# Patient Record
Sex: Male | Born: 1990 | Race: Black or African American | Hispanic: No | Marital: Single | Smoking: Never smoker
Health system: Southern US, Community
[De-identification: ages and names within clinical notes are randomized; demographics above are authoritative.]

## PROBLEM LIST (undated history)

## (undated) DIAGNOSIS — S83412A Sprain of medial collateral ligament of left knee, initial encounter: Secondary | ICD-10-CM

---
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
## 2012-02-20 ENCOUNTER — Encounter (HOSPITAL_COMMUNITY): Payer: Self-pay | Admitting: Emergency Medicine

## 2012-02-20 ENCOUNTER — Emergency Department (HOSPITAL_COMMUNITY): Payer: Self-pay

## 2012-02-20 ENCOUNTER — Emergency Department (HOSPITAL_COMMUNITY)
Admission: EM | Admit: 2012-02-20 | Discharge: 2012-02-20 | Disposition: A | Discharge status: Home Health | Payer: Self-pay | Attending: Emergency Medicine | Admitting: Emergency Medicine

## 2012-02-20 DIAGNOSIS — S6990XA Unspecified injury of unspecified wrist, hand and finger(s), initial encounter: Secondary | ICD-10-CM | POA: Insufficient documentation

## 2012-02-20 DIAGNOSIS — S6390XA Sprain of unspecified part of unspecified wrist and hand, initial encounter: Secondary | ICD-10-CM

## 2012-02-20 DIAGNOSIS — W19XXXA Unspecified fall, initial encounter: Secondary | ICD-10-CM | POA: Insufficient documentation

## 2012-02-20 NOTE — ED Provider Notes (Signed)
History   This chart was scribed for Jay Gaskins, MD by Sofie Rower. The patient was seen in room TR08C/TR08C and the patient's care was started at 2:16 PM    CSN: [id number]  Arrival date & time 02/20/12  1402   None     Chief Complaint  Patient presents with  . Hand Pain     Patient is a 21 y.o. male presenting with hand pain. The history is provided by the patient. No language interpreter was used.  Hand Pain This is a new problem. The current episode started more than 1 week ago. The problem occurs constantly. The problem has not changed since onset.The symptoms are aggravated by exertion. Nothing relieves the symptoms. He has tried nothing for the symptoms. The treatment provided no relief.  pt reports he fell several weeks ago and FOOSH.  No injuries reported other than pain in left hand.  No head injury.  No other extremity injury.  Denies weakness/numbness to his hands  PMH - none  History reviewed. No pertinent past surgical history.  No family history on file.  History  Substance Use Topics  . Smoking status: Never Smoker   . Smokeless tobacco: Not on file  . Alcohol Use:       Review of Systems  Constitutional: Negative for fever.  Musculoskeletal: Negative for joint swelling.  Neurological: Negative for weakness and numbness.    Allergies  Review of patient's allergies indicates no known allergies.  Home Medications  No current outpatient prescriptions on file.  BP 143/76  Pulse 65  Temp 98.3 F (36.8 C) (Oral)  Resp 20  SpO2 98%  Physical Exam  CONSTITUTIONAL: Well developed/well nourished HEAD AND FACE: Normocephalic/atraumatic EYES: EOMI/PERRL ENMT: Mucous membranes moist NECK: supple no meningeal signs SPINE:entire spine nontender CV: S1/S2 noted, no murmurs/rubs/gallops noted LUNGS: Lungs are clear to auscultation bilaterally, no apparent distress ABDOMEN: soft, nontender, no rebound or guarding GU:no cva tenderness NEURO: Pt is  awake/alert, moves all extremitiesx4 EXTREMITIES: pulses normal, full ROM, left hand tenderness over the ulnar aspect, no snuff box tenderness.  No swelling/bruising noted.  No deformity.  No wrist tenderness. No evidence of tendon injury.  No open skin/erythema noted SKIN: warm, color normal PSYCH: no abnormalities of mood noted   ED Course  Procedures   DIAGNOSTIC STUDIES: Oxygen Saturation is 98% on room air, normal by my interpretation.    COORDINATION OF CARE:  2:18PM- EDP at bedside discusses treatment plan concerning x-ray of left hand.   2:58PM- EDP at bedside to discuss x-ray results.   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  No obvious injury, and I doubt occult fracture (doubt scaphoid injury as no snuffbox tenderness) He reports when he lifts weights/does pushups he has pain.  He has no edema/bruising and can range all fingers/wrist without difficulty.  I offered splint to rest the hand but he refused.  He reports he will f/u with trainer at college.    Dg Hand Complete Left  02/20/2012  *RADIOLOGY REPORT*  Clinical Data: Pain post fall 1 week ago  LEFT HAND - COMPLETE 3+ VIEW  Comparison: None.  Findings: Three views of the left hand submitted.  No acute fracture or subluxation.  No periosteal reaction or bony erosion. No radiopaque foreign body.  IMPRESSION: No acute fracture or subluxation.  Original Report Authenticated By: Natasha Mead, M.D.        MDM  Nursing notes including past medical history and social history reviewed  and considered in documentation xrays reviewed and considered      I personally performed the services described in this documentation, which was scribed in my presence. The recorded information has been reviewed and considered.     Jay Gaskins, MD 02/20/12 1544

## 2012-02-20 NOTE — Progress Notes (Signed)
Consulted by Johnny Bridge RN to obtain sickle center trait testing information for the patient. I contacted the [facility] sickle cell center and spoke with Tywan. He gave me the name and number of the [facility] [phone] at [address]). The offices were closed today (and every Friday) so could not find out the cost, but suspect they are minimal if any. This information was scribed and given to the patient with his verbal appreciation.

## 2012-02-20 NOTE — ED Notes (Signed)
Pt fell on left hand a few weeks ago and it still hurts. Pain is mostly on palm can wiggle fingers has good pulse

## 2012-02-20 NOTE — ED Notes (Signed)
Pt returned from radiology.

## 2014-10-13 ENCOUNTER — Emergency Department (HOSPITAL_COMMUNITY): Payer: 59

## 2014-10-13 ENCOUNTER — Emergency Department (HOSPITAL_COMMUNITY)
Admission: EM | Admit: 2014-10-13 | Discharge: 2014-10-13 | Disposition: A | Discharge status: Home / Self Care | Payer: 59 | Attending: Emergency Medicine | Admitting: Emergency Medicine

## 2014-10-13 ENCOUNTER — Encounter (HOSPITAL_COMMUNITY): Payer: Self-pay | Admitting: Family Medicine

## 2014-10-13 DIAGNOSIS — J029 Acute pharyngitis, unspecified: Secondary | ICD-10-CM | POA: Diagnosis present

## 2014-10-13 DIAGNOSIS — J039 Acute tonsillitis, unspecified: Secondary | ICD-10-CM | POA: Diagnosis not present

## 2014-10-13 LAB — RAPID STREP SCREEN (MED CTR MEBANE ONLY): Streptococcus, Group A Screen (Direct): NEGATIVE

## 2014-10-13 MED ORDER — IOHEXOL 300 MG/ML  SOLN
75.0000 mL | Freq: Once | INTRAMUSCULAR | Status: AC | PRN
  Administered 2014-10-13: 75 mL via INTRAVENOUS

## 2014-10-13 MED ORDER — ACETAMINOPHEN 500 MG PO TABS
1000.0000 mg | ORAL_TABLET | Freq: Once | ORAL | Status: AC
  Administered 2014-10-13: 1000 mg via ORAL
  Filled 2014-10-13: qty 2

## 2014-10-13 MED ORDER — DEXAMETHASONE SODIUM PHOSPHATE 10 MG/ML IJ SOLN: 10.0000 mg | Freq: Once | INTRAMUSCULAR | Status: AC

## 2014-10-13 MED ORDER — DEXAMETHASONE SODIUM PHOSPHATE 10 MG/ML IJ SOLN
10.0000 mg | Freq: Once | INTRAMUSCULAR | Status: DC
  Filled 2014-10-13: qty 1

## 2014-10-13 MED ORDER — DEXAMETHASONE SODIUM PHOSPHATE 10 MG/ML IJ SOLN
10.0000 mg | Freq: Once | INTRAMUSCULAR | Status: AC
  Administered 2014-10-13: 10 mg via INTRAVENOUS

## 2014-10-13 NOTE — ED Notes (Signed)
Pt comfortable with discharge and follow up instructions. Pt declines wheelchair, escorted to waiting area by this RN. No prescriptions. 

## 2014-10-13 NOTE — ED Provider Notes (Signed)
CSN: [id number]     Arrival date & time 10/13/14  1017 History   First MD Initiated Contact with Patient 10/13/14 1058     Chief Complaint  Patient presents with  . Sore Throat  . Headache     (Consider location/radiation/quality/duration/timing/severity/associated sxs/prior Treatment) HPI Jay Rodgers is a 24 y.o. male who comes in for evaluation of sore throat. Patient states for the past 3 days he has had increasingly sore throat reports he has been choking on his uvula intermittently. The sensation is worse when he lays down to sleep. He reports associated headache that is similar to headaches he has had in the past. He denies difficulties breathing, difficulties eating or drinking, swallowing. Denies drooling or fevers at home. He is tried one aspirin 325 mg without relief of his symptoms. He rates his discomfort as a 6/10. No other aggravating or modifying factors. Denies headache, changes in vision, chest pain, shortness of breath, myalgias, urinary symptoms, weakness or numbness, syncope, rash.  History reviewed. No pertinent past medical history. History reviewed. No pertinent past surgical history. History reviewed. No pertinent family history. History  Substance Use Topics  . Smoking status: Never Smoker   . Smokeless tobacco: Not on file  . Alcohol Use: Not on file    Review of Systems A 10 point review of systems was completed and was negative except for pertinent positives and negatives as mentioned in the history of present illness     Allergies  Review of patient's allergies indicates no known allergies.  Home Medications   Prior to Admission medications   Medication Sig Start Date End Date Taking? Authorizing Provider  aspirin 325 MG tablet Take 325 mg by mouth every 4 (four) hours as needed for mild pain.   Yes Historical Provider, MD   BP 132/64 mmHg  Pulse 68  Temp(Src) 100.5 F (38.1 C) (Oral)  Resp 18  SpO2 100% Physical Exam  Constitutional: He is  oriented to person, place, and time. He appears well-developed and well-nourished.  HENT:  Head: Normocephalic and atraumatic.  Mouth/Throat: Oropharynx is clear and moist.  Bilateral tonsillitis without exudate. Posterior oropharynx is erythematous and inflamed. Patient is tolerating secretions well. Patent airway. Uvula midline. No trismus or glossal elevation.  Eyes: Conjunctivae are normal. Pupils are equal, round, and reactive to light. Right eye exhibits no discharge. Left eye exhibits no discharge. No scleral icterus.  Neck: Neck supple.  Cardiovascular: Normal rate, regular rhythm and normal heart sounds.   Pulmonary/Chest: Effort normal and breath sounds normal. No respiratory distress. He has no wheezes. He has no rales.  Abdominal: Soft. There is no tenderness.  Musculoskeletal: He exhibits no tenderness.  Neurological: He is alert and oriented to person, place, and time.  Cranial Nerves II-XII grossly intact  Skin: Skin is warm and dry. No rash noted.  Psychiatric: He has a normal mood and affect.  Nursing note and vitals reviewed.   ED Course  Procedures (including critical care time) Labs Review Labs Reviewed  RAPID STREP SCREEN  CULTURE, GROUP A STREP    Imaging Review Ct Soft Tissue Neck W Contrast  10/13/2014   CLINICAL DATA:  Pt here for headache and swollen uvula and tonsils pt sts that when he lays down, it is hard to breathe and swallowR/o abscess  EXAM: CT NECK WITH CONTRAST  TECHNIQUE: Multidetector CT imaging of the neck was performed using the standard protocol following the bolus administration of intravenous contrast.  CONTRAST:  75mL OMNIPAQUE IOHEXOL 300  MG/ML  SOLN  COMPARISON:  None.  FINDINGS: Pharynx and larynx: There is enlargement of the palatine tonsils, symmetric, and the posterior pharyngeal, adenoidal, soft tissues. There is no fluid collection to suggest an abscess. The laryngeal structures are within normal limits. The oral pharyngeal airway is  mild to moderately narrowed by the enlarged palatine tonsils.  Salivary glands: Unremarkable  Thyroid: Within normal limits  Lymph nodes: No enlarged or abnormal lymph nodes.  Vascular: No vascular abnormality.  Limited intracranial: Unremarkable.  Visualized orbits: Normal.  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 Bony structures otherwise unremarkable.  Upper chest: Clear visualized lungs. Upper mediastinum is unremarkable.  IMPRESSION: 1. Symmetric enlargement of the palatine tonsils causing mild to moderate narrowing of the oral pharyngeal airway. Enlargement of the posterior nasopharyngeal, adenoidal, soft tissues. 2. No evidence of an abscess. 3. No enlarged or abnormal lymph nodes. 4. No other significant abnormalities.   Electronically Signed   By: Amie Portland M.D.   On: 10/13/2014 13:49     EKG Interpretation None     Meds given in ED:  Medications  acetaminophen (TYLENOL) tablet 1,000 mg (1,000 mg Oral Given 10/13/14 1235)  dexamethasone (DECADRON) injection 10 mg (0 mg Intramuscular Duplicate 10/13/14 1243)  dexamethasone (DECADRON) injection 10 mg (10 mg Intravenous Given 10/13/14 1242)  iohexol (OMNIPAQUE) 300 MG/ML solution 75 mL (75 mLs Intravenous Contrast Given 10/13/14 1315)    New Prescriptions   No medications on file   Filed Vitals:   10/13/14 1130 10/13/14 1200 10/13/14 1230 10/13/14 1300  BP: 141/65 138/72 141/70 132/64  Pulse: 72 64 62 68  Temp:      TempSrc:      Resp:      SpO2: 98% 98% 98% 100%    MDM  Vitals stable - WNL -afebrile Pt resting comfortably in ED. Tolerating PO PE--bilateral tonsillitis without trismus, glossal elevation, drooling. No evidence of retropharyngeal, peritonsillar abscess, Ludwig angina. Labwork-rapid strep negative Imaging-CT neck soft tissue shows no evidence of abscess or enlarged or abnormal lymph nodes. There is symmetric enlargement of  palate and tonsils.  Discussed symptomatic support as well as salt water gargles. Discussed follow-up on culture on Monday. Motrin/Tylenol for fevers and discomfort. I discussed all relevant lab findings and imaging results with pt and they verbalized understanding. Discussed f/u with PCP within 48 hrs and return precautions, pt very amenable to plan.  Final diagnoses:  Tonsillitis        Joycie Peek, PA-C 10/13/14 2006  Blane Ohara, MD 10/14/14 [phone]

## 2014-10-13 NOTE — ED Notes (Signed)
Pt here for headache and swollen uvula and tonsils. sts that when he lays down its hard to breathe and swallow.

## 2014-10-13 NOTE — Discharge Instructions (Signed)

## 2014-10-15 LAB — CULTURE, GROUP A STREP: Strep A Culture: NEGATIVE

## 2018-08-19 ENCOUNTER — Encounter (HOSPITAL_COMMUNITY): Payer: Self-pay

## 2018-08-19 ENCOUNTER — Emergency Department (HOSPITAL_COMMUNITY)
Admission: EM | Admit: 2018-08-19 | Discharge: 2018-08-19 | Disposition: A | Discharge status: Home / Self Care | Payer: Self-pay | Attending: Emergency Medicine | Admitting: Emergency Medicine

## 2018-08-19 ENCOUNTER — Emergency Department (HOSPITAL_COMMUNITY): Payer: Self-pay

## 2018-08-19 DIAGNOSIS — M25461 Effusion, right knee: Secondary | ICD-10-CM | POA: Insufficient documentation

## 2018-08-19 DIAGNOSIS — M25562 Pain in left knee: Secondary | ICD-10-CM

## 2018-08-19 DIAGNOSIS — M25561 Pain in right knee: Secondary | ICD-10-CM | POA: Insufficient documentation

## 2018-08-19 DIAGNOSIS — X500XXA Overexertion from strenuous movement or load, initial encounter: Secondary | ICD-10-CM | POA: Insufficient documentation

## 2018-08-19 DIAGNOSIS — Y999 Unspecified external cause status: Secondary | ICD-10-CM | POA: Insufficient documentation

## 2018-08-19 DIAGNOSIS — Y9366 Activity, soccer: Secondary | ICD-10-CM | POA: Insufficient documentation

## 2018-08-19 DIAGNOSIS — Y92322 Soccer field as the place of occurrence of the external cause: Secondary | ICD-10-CM | POA: Insufficient documentation

## 2018-08-19 HISTORY — DX: Sprain of medial collateral ligament of left knee, initial encounter: S83.412A

## 2018-08-19 MED ORDER — NAPROXEN 250 MG PO TABS
500.0000 mg | ORAL_TABLET | Freq: Once | ORAL | Status: AC
  Administered 2018-08-19: 500 mg via ORAL
  Filled 2018-08-19: qty 2

## 2018-08-19 NOTE — ED Provider Notes (Signed)
[facility] EMERGENCY DEPARTMENT Provider Note   CSN: [id number] Arrival date & time: 08/19/18  1722     History   Chief Complaint Chief Complaint  Patient presents with  . Knee Pain    HPI Derak Fenstermaker is a 28 y.o. male.  28 y/o male with a PMH of MCL complete tear presents to the ED with a chief complaint of left knee pain since yesterday. Patient reports playing soccer when he felt something "pop" while kicking the ball. He states the pain is located on the medial aspect and is worse with weight bearing and ambulation.  He has a previous history of an MCL injury which he completed physical therapy. He has tried tylenol for his pain but reports no relieve in symptoms. He states the swelling in his knee is increasing and he is now ambulating with a cane. He denies any weakness, tingling, swelling to his calf, or other complaints at this time.      Past Medical History:  Diagnosis Date  . Complete tear of MCL of knee, left, initial encounter     Patient Active Problem List   Diagnosis Date Noted  . LEUKOCYTOSIS 01/08/2009  . 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 No pertinent surgical history.      Home Medications    Prior to Admission medications   Medication Sig Start Date End Date Taking? Authorizing Provider  aspirin 325 MG tablet Take 325 mg by mouth every 4 (four) hours as needed for mild pain.    [provider]    Family History History reviewed. No pertinent family history.  Social History Social History   Tobacco Use  . Smoking status: Never Smoker  Substance Use Topics  . Alcohol use: Yes    Comment: occ  . Drug use: Never     Allergies   Patient has no known allergies.   Review of Systems Review of Systems  Constitutional: Negative for fever.  Musculoskeletal: Positive for arthralgias and joint swelling.     Physical Exam Updated Vital Signs BP (!) 155/88 (BP  Location: Right Arm)   Pulse 73   Temp 98.6 F (37 C) (Oral)   Resp 18   SpO2 95%   Physical Exam Vitals signs and nursing note reviewed.  Constitutional:      Appearance: He is well-developed.  HENT:     Head: Normocephalic and atraumatic.  Eyes:     General: No scleral icterus.    Pupils: Pupils are equal, round, and reactive to light.  Neck:     Musculoskeletal: Normal range of motion.  Cardiovascular:     Heart sounds: Normal heart sounds.  Pulmonary:     Effort: Pulmonary effort is normal.     Breath sounds: Normal breath sounds. No wheezing.  Chest:     Chest wall: No tenderness.  Abdominal:     General: Bowel sounds are normal. There is no distension.     Palpations: Abdomen is soft.     Tenderness: There is no abdominal tenderness.  Musculoskeletal:        General: No tenderness or deformity.     Right knee: He exhibits swelling and effusion. He exhibits normal range of motion, no ecchymosis, no deformity, no laceration, no erythema, normal alignment and no LCL laxity.     Comments: Pain is worse with flexion of the left knee, swelling noted to the knee. No erythema present. Warmth to touch.  Skin:    General: Skin is warm and dry.  Neurological:     Mental Status: He is alert and oriented to person, place, and time.      ED Treatments / Results  Labs (all labs ordered are listed, but only abnormal results are displayed) Labs Reviewed - No data to display  EKG None  Radiology Dg Knee Complete 4 Views Left  Result Date: 08/19/2018 CLINICAL DATA:  Knee pain EXAM: LEFT KNEE - COMPLETE 4+ VIEW COMPARISON:  None. FINDINGS: No fracture or malalignment. Joint spaces are within normal limits. Moderate knee effusion. IMPRESSION: No acute osseous abnormality.  Moderate knee effusion. Electronically Signed   By: Jasmine PangKim  Fujinaga M.D.   On: 08/19/2018 18:38    Procedures Procedures (including critical care time)  Medications Ordered in ED Medications  naproxen  (NAPROSYN) tablet 500 mg (500 mg Oral Given 08/19/18 1850)     Initial Impression / Assessment and Plan / ED Course  I have reviewed the triage vital signs and the nursing notes.  Pertinent labs & imaging results that were available during my care of the patient were reviewed by me and considered in my medical decision making (see chart for details).    A previous history of an MCL injury presents to the ED with left knee pain which he injured recently.  Patient reports this is a recurrent complaint for him but after playing soccer yesterday he felt a pop on his left knee.  He reports not attempting any medical therapy for relieving symptoms.  During evaluation patient has swelling to his left knee.  Provide him with anti-inflammatories.  X-ray obtained which showed no acute fracture or dislocation a moderate joint effusion.  Will provide him with referral to orthopedics for further evaluation.  Patient discharged in stable condition.  Return precautions provided.    Final Clinical Impressions(s) / ED Diagnoses   Final diagnoses:  Acute pain of left knee    ED Discharge Orders    None       Claude MangesSoto, Lamyah Creed, PA-C 08/19/18 1859    Jacalyn LefevreHaviland, Julie, MD 08/20/18 [phone]

## 2018-08-19 NOTE — ED Triage Notes (Signed)
Pt endorses feeling a pop in left knee while playing soccer yesterday. Has prior hx of torn MCL in same knee. VSS.

## 2018-08-19 NOTE — Discharge Instructions (Addendum)
Your xray today was normal with no acute findings. I have provided a referral lease to schedule an appointment in order to further evaluate your knee pain.  You may take over-the-counter Aleve to help with your symptoms along with place ice to your knee and elevate.

## 2018-08-19 NOTE — ED Notes (Signed)
Pt verbalized understanding of discharge instructions and denies any further questions at this time.   

## 2018-08-20 ENCOUNTER — Telehealth (HOSPITAL_COMMUNITY): Payer: Self-pay

## 2018-08-20 DIAGNOSIS — S8392XA Sprain of unspecified site of left knee, initial encounter: Secondary | ICD-10-CM | POA: Insufficient documentation

## 2019-02-03 ENCOUNTER — Ambulatory Visit: Payer: Self-pay | Admitting: *Deleted

## 2019-02-03 DIAGNOSIS — Z20822 Contact with and (suspected) exposure to covid-19: Secondary | ICD-10-CM

## 2019-02-03 NOTE — Telephone Encounter (Signed)
Patient is calling to request COVID testing. Patient is having symptoms and he has had exposure- chills, cold- congestion, exposure. Patient is uninsured and does not have PCP. Patient triaged and scheduled for testing.  Reason for Disposition . [1] COVID-19 infection suspected by caller or triager AND [2] mild symptoms (cough, fever, or others) AND [3] no complications or SOB  Answer Assessment - Initial Assessment Questions 1. CLOSE CONTACT: "Who is the person with the confirmed or suspected COVID-19 infection that you were exposed to?"     friend 2. PLACE of CONTACT: "Where were you when you were exposed to COVID-19?" (e.g., home, school, medical waiting room; which city?)     At a bar 3. TYPE of CONTACT: "How much contact was there?" (e.g., sitting next to, live in same house, work in same office, same building)     Sitting next to person- sharing drinks 4. DURATION of CONTACT: "How long were you in contact with the COVID-19 patient?" (e.g., a few seconds, passed by person, a few minutes, live with the patient)     3-4 hours 5. DATE of CONTACT: "When did you have contact with a COVID-19 patient?" (e.g., how many days ago)     7/7 6. TRAVEL: "Have you traveled out of the country recently?" If so, "When and where?"     * Also ask about out-of-state travel, since the CDC has identified some high-risk cities for community spread in the [place].     * Note: Travel becomes less relevant if there is widespread community transmission where the patient lives.     no 7. COMMUNITY SPREAD: "Are there lots of cases of COVID-19 (community spread) where you live?" (See public health department website, if unsure)       minor 8. SYMPTOMS: "Do you have any symptoms?" (e.g., fever, cough, breathing difficulty)     Chills, nasal congestion, cough, feverish 9. PREGNANCY OR POSTPARTUM: "Is there any chance you are pregnant?" "When was your last menstrual period?" "Did you deliver in the last 2 weeks?"      n/a 10. HIGH RISK: "Do you have any heart or lung problems? Do you have a weak immune system?" (e.g., CHF, COPD, asthma, HIV positive, chemotherapy, renal failure, diabetes mellitus, sickle cell anemia)       no  Protocols used: CORONAVIRUS (COVID-19) DIAGNOSED OR SUSPECTED-A-AH, CORONAVIRUS (COVID-19) EXPOSURE-A-AH

## 2019-02-04 ENCOUNTER — Other Ambulatory Visit: Payer: Self-pay

## 2019-02-04 DIAGNOSIS — Z20822 Contact with and (suspected) exposure to covid-19: Secondary | ICD-10-CM

## 2019-02-09 LAB — NOVEL CORONAVIRUS, NAA: SARS-CoV-2, NAA: NOT DETECTED

## 2019-02-16 ENCOUNTER — Telehealth: Payer: Self-pay | Admitting: General Practice

## 2019-02-16 NOTE — Telephone Encounter (Signed)
Pt was given covid-19(not detected) result/ Pt verbalized understanding  °

## 2019-06-08 ENCOUNTER — Other Ambulatory Visit: Payer: Self-pay

## 2019-06-08 DIAGNOSIS — Z20822 Contact with and (suspected) exposure to covid-19: Secondary | ICD-10-CM

## 2019-06-10 LAB — NOVEL CORONAVIRUS, NAA: SARS-CoV-2, NAA: NOT DETECTED

## 2019-11-08 ENCOUNTER — Other Ambulatory Visit: Payer: Self-pay

## 2019-11-08 ENCOUNTER — Ambulatory Visit (INDEPENDENT_AMBULATORY_CARE_PROVIDER_SITE_OTHER): Payer: Managed Care, Other (non HMO) | Admitting: Orthopedic Surgery

## 2019-11-08 ENCOUNTER — Ambulatory Visit (INDEPENDENT_AMBULATORY_CARE_PROVIDER_SITE_OTHER): Payer: Managed Care, Other (non HMO)

## 2019-11-08 DIAGNOSIS — M25562 Pain in left knee: Secondary | ICD-10-CM | POA: Diagnosis not present

## 2019-11-08 DIAGNOSIS — M25362 Other instability, left knee: Secondary | ICD-10-CM | POA: Diagnosis not present

## 2019-11-08 DIAGNOSIS — G8929 Other chronic pain: Secondary | ICD-10-CM

## 2019-11-09 ENCOUNTER — Encounter: Payer: Self-pay | Admitting: Orthopedic Surgery

## 2019-11-09 NOTE — Progress Notes (Signed)
Office Visit Note   Patient: Jay Rodgers           Date of Birth: [birth year]           MRN: [id number] Visit Date: 11/08/2019              Requested by: Mare Loan, MD [address] [place],  [place] [address] PCP: Patient, No Pcp Per  Chief Complaint  Patient presents with  . Left Knee - Pain      HPI: The patient is a 29 year old gentleman who is seen today for a complaint of greater than 1 year history of left knee pain.  Initially he was playing pickup soccer over a year ago when he pivoted on the left knee and had immediate onset of swelling and pain.  He was unable to bear weight initially.  He was told by a physician at the time of the injury he likely had a medial meniscal tear but unfortunately was unable to proceed with MRI due to uninsured status.  He gradually had improvement of his pain he has pain mainly if he increases his activity recently began playing pickup soccer again and has had resumption of feeling of looseness and instability of the left knee exquisite pain with pivoting and planted turns of the left knee.  Intermittent swelling.  Has had giving way and falls.  Assessment & Plan: Visit Diagnoses:  1. Chronic pain of left knee     Plan: We will defer Depo-Medrol injection pain patient is currently pain-free. we will proceed with MRI of the left knee evaluating for meniscal injury.  Follow-Up Instructions: No follow-ups on file.   Left Knee Exam   Tenderness  The patient is experiencing tenderness in the medial joint line (Mild).  Range of Motion  The patient has normal left knee ROM.  Tests  Varus: negative Valgus: negative  Other  Erythema: absent Swelling: mild Effusion: no effusion present      Patient is alert, oriented, no adenopathy, well-dressed, normal affect, normal respiratory effort.   Imaging: No results found. No images are attached to the encounter.  Labs: No results found for: HGBA1C, ESRSEDRATE, CRP, LABURIC,  REPTSTATUS, GRAMSTAIN, CULT, LABORGA   Lab Results  Component Value Date   ALBUMIN 4.4 07/23/2010   ALBUMIN 4.2 04/17/2010   ALBUMIN 4.1 01/05/2009    No results found for: MG No results found for: VD25OH  No results found for: PREALBUMIN CBC EXTENDED Latest Ref Rng & Units 04/17/2010 01/05/2009  WBC 4.5 - 13.5 10*3/microliter 5.4 3.5(L)  RBC 3.80 - 5.70 M/uL 4.89 4.95  HGB 12.0 - 16.0 g/dL 11.9 41.7  HCT 40.8 - 14.4 % 42.7 43.2  PLT 150 - 400 K/uL 232 187  NEUTROABS 1.7 - 8.0 K/uL 2.0 1.0(L)  LYMPHSABS 1.1 - 4.8 K/uL 2.2 1.7     There is no height or weight on file to calculate BMI.  Orders:  Orders Placed This Encounter  Procedures  . XR Knee 1-2 Views Left  . MR Knee Left w/o contrast   No orders of the defined types were placed in this encounter.    Procedures: No procedures performed  Clinical Data: No additional findings.  ROS:  All other systems negative, except as noted in the HPI. Review of Systems  Constitutional: Negative for chills and fever.  Musculoskeletal: Positive for arthralgias, gait problem and joint swelling.  Neurological: Negative for weakness and numbness.    Objective: Vital Signs: There were no vitals taken  for this visit.  Specialty Comments:  No specialty comments available.  PMFS History: Patient Active Problem List   Diagnosis Date Noted  . LEUKOCYTOSIS 01/08/2009  . 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 Complete tear of MCL of knee, left, initial encounter     No family history on file.  No past surgical history on file. Social History   Occupational History  . Not on file  Tobacco Use  . Smoking status: Never Smoker  Substance and Sexual Activity  . Alcohol use: Yes    Comment: occ  . Drug use: Never  . Sexual activity: Not on file

## 2019-12-07 ENCOUNTER — Ambulatory Visit
Admission: RE | Admit: 2019-12-07 | Discharge: 2019-12-07 | Disposition: A | Discharge status: Home / Self Care | Payer: Managed Care, Other (non HMO) | Source: Ambulatory Visit | Attending: Family | Admitting: Family

## 2019-12-07 ENCOUNTER — Other Ambulatory Visit: Payer: Self-pay

## 2019-12-07 DIAGNOSIS — M25562 Pain in left knee: Secondary | ICD-10-CM

## 2019-12-09 ENCOUNTER — Other Ambulatory Visit: Payer: Managed Care, Other (non HMO)

## 2019-12-13 ENCOUNTER — Ambulatory Visit (INDEPENDENT_AMBULATORY_CARE_PROVIDER_SITE_OTHER): Payer: Managed Care, Other (non HMO) | Admitting: Orthopedic Surgery

## 2019-12-13 ENCOUNTER — Other Ambulatory Visit: Payer: Self-pay

## 2019-12-13 VITALS — Ht 66.0 in | Wt 176.0 lb

## 2019-12-13 DIAGNOSIS — G8929 Other chronic pain: Secondary | ICD-10-CM | POA: Diagnosis not present

## 2019-12-13 DIAGNOSIS — M25562 Pain in left knee: Secondary | ICD-10-CM

## 2019-12-15 ENCOUNTER — Telehealth: Payer: Self-pay | Admitting: Orthopedic Surgery

## 2019-12-15 NOTE — Telephone Encounter (Signed)
Patient called asked if the baker's cyst will cause him any problems in the future. Patient asked what was the name of the exercise he should be doing? Patient asked how soon after the cortisone injection can he return to playing soccer, running and riding bike. The number to contact patient is [phone]

## 2019-12-15 NOTE — Telephone Encounter (Signed)
I called to advise that he may be activity as he can tolerate after the injection. He can work on isometric straight leg raises and the cortisone injection will help with inflammation of the joint and that it is possible that bakers cyst will dissipate on its own and should not cause any longer term problems down the road.  Voiced understanding and to call with any questions.

## 2019-12-19 ENCOUNTER — Encounter: Payer: Self-pay | Admitting: Orthopedic Surgery

## 2019-12-19 NOTE — Progress Notes (Signed)
Office Visit Note   Patient: Jay Rodgers           Date of Birth: [birth year]           MRN: [id number] Visit Date: 12/13/2019              Requested by: No referring provider defined for this encounter. PCP: Patient, No Pcp Per  Chief Complaint  Patient presents with  . Left Knee - Follow-up    MRI review left knee       HPI: Patient is a 29 year old gentleman who complains of global pain around the left knee status post an MRI scan denies any catching locking giving way symptoms.  Assessment & Plan: Visit Diagnoses:  1. Chronic pain of left knee     Plan: Discussed and reviewed the MRI findings which shows no meniscal or ligament pathology.  Discussed that he does have hypermobility of the patella and the importance of VMO strengthening exercises were demonstrated.  Follow-Up Instructions: Return if symptoms worsen or fail to improve.   Ortho Exam  Patient is alert, oriented, no adenopathy, well-dressed, normal affect, normal respiratory effort. Examination patient does have hypermobility of the patella with tenderness over the medial lateral facets of the patella it does not dislocate.  There is no tenderness palpation of the medial lateral joint line collaterals and cruciates are stable.  Review of the MRI scan shows no meniscal or ligamentous pathology patient does have a little bit of effusion no loose body.  Imaging: No results found. No images are attached to the encounter.  Labs: No results found for: HGBA1C, ESRSEDRATE, CRP, LABURIC, REPTSTATUS, GRAMSTAIN, CULT, LABORGA   Lab Results  Component Value Date   ALBUMIN 4.4 07/23/2010   ALBUMIN 4.2 04/17/2010   ALBUMIN 4.1 01/05/2009    No results found for: MG No results found for: VD25OH  No results found for: PREALBUMIN CBC EXTENDED Latest Ref Rng & Units 04/17/2010 01/05/2009  WBC 4.5 - 13.5 10*3/microliter 5.4 3.5(L)  RBC 3.80 - 5.70 M/uL 4.89 4.95  HGB 12.0 - 16.0 g/dL 86.7 61.9  HCT 50.9 - 32.6 %  42.7 43.2  PLT 150 - 400 K/uL 232 187  NEUTROABS 1.7 - 8.0 K/uL 2.0 1.0(L)  LYMPHSABS 1.1 - 4.8 K/uL 2.2 1.7     Body mass index is 28.41 kg/m.  Orders:  No orders of the defined types were placed in this encounter.  No orders of the defined types were placed in this encounter.    Procedures: No procedures performed  Clinical Data: No additional findings.  ROS:  All other systems negative, except as noted in the HPI. Review of Systems  Objective: Vital Signs: Ht 5\' 6"  (1.676 m)   Wt 176 lb (79.8 kg)   BMI 28.41 kg/m   Specialty Comments:  No specialty comments available.  PMFS History: Patient Active Problem List   Diagnosis Date Noted  . LEUKOCYTOSIS 01/08/2009  . 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 Complete tear of MCL of knee, left, initial encounter     History reviewed. No pertinent family history.  History reviewed. No pertinent surgical history. Social History   Occupational History  . Not on file  Tobacco Use  . Smoking status: Never Smoker  Substance and Sexual Activity  . Alcohol use: Yes    Comment: occ  . Drug use: Never  . Sexual activity: Not on file

## 2021-11-17 DIAGNOSIS — J309 Allergic rhinitis, unspecified: Secondary | ICD-10-CM | POA: Insufficient documentation

## 2021-11-17 DIAGNOSIS — E782 Mixed hyperlipidemia: Secondary | ICD-10-CM | POA: Insufficient documentation

## 2021-11-17 DIAGNOSIS — M2350 Chronic instability of knee, unspecified knee: Secondary | ICD-10-CM | POA: Insufficient documentation

## 2021-12-12 ENCOUNTER — Ambulatory Visit: Payer: 59

## 2021-12-12 ENCOUNTER — Ambulatory Visit: Payer: 59 | Admitting: Podiatry

## 2021-12-12 ENCOUNTER — Encounter: Payer: Self-pay | Admitting: Podiatry

## 2021-12-12 DIAGNOSIS — M778 Other enthesopathies, not elsewhere classified: Secondary | ICD-10-CM | POA: Diagnosis not present

## 2021-12-12 DIAGNOSIS — M7752 Other enthesopathy of left foot: Secondary | ICD-10-CM | POA: Diagnosis not present

## 2021-12-12 DIAGNOSIS — M722 Plantar fascial fibromatosis: Secondary | ICD-10-CM

## 2021-12-12 NOTE — Progress Notes (Signed)
  Subjective:  Patient ID: Jay Rodgers, male    DOB: June 19, 1991,  MRN: [id number] HPI Chief Complaint  Patient presents with   Foot Pain    5th MPJ left - aching x 1.5 months, active in soccer, but no injury, stopped soccer for 3 weeks and still had pain, seen PCP-xrayed, referred to our office    New Patient (Initial Visit)    31 y.o. male presents with the above complaint.   ROS: Denies fever chills nausea vomiting muscle aches pains calf pain back pain chest pain shortness of breath.  Past Medical History:  Diagnosis Date   Complete tear of MCL of knee, left, initial encounter    No past surgical history on file. No current outpatient medications on file.  No Known Allergies Review of Systems Objective:  There were no vitals filed for this visit.  General: Well developed, nourished, in no acute distress, alert and oriented x3   Dermatological: Skin is warm, dry and supple bilateral. Nails x 10 are well maintained; remaining integument appears unremarkable at this time. There are no open sores, no preulcerative lesions, no rash or signs of infection present.  Vascular: Dorsalis Pedis artery and Posterior Tibial artery pedal pulses are 2/4 bilateral with immedate capillary fill time. Pedal hair growth present. No varicosities and no lower extremity edema present bilateral.   Neruologic: Grossly intact via light touch bilateral. Vibratory intact via tuning fork bilateral. Protective threshold with Semmes Wienstein monofilament intact to all pedal sites bilateral. Patellar and Achilles deep tendon reflexes 2+ bilateral. No Babinski or clonus noted bilateral.   Musculoskeletal: No gross boney pedal deformities bilateral. No pain, crepitus, or limitation noted with foot and ankle range of motion bilateral. Muscular strength 5/5 in all groups tested bilateral.  Pain on palpation of the joint plantarly primarily at the level of the fifth metatarsal phalangeal joint he has no pain on  palpation of the diaphysis or metaphysis of the bone.  He does have pain on end range of motion of the fifth metatarsal phalangeal joint.  There is no swelling no sign of infection.  Gait: Unassisted, Nonantalgic.    Radiographs:  Radiographs were taken previously by primary care provider at [facility] physicians.  We are not able to visualize these and the patient did not want duplicate x-rays.  Assessment & Plan:   Assessment: Capsulitis fifth metatarsophalangeal joint left foot  Plan: Discussed etiology pathology and surgical therapies offered him an injection of dexamethasone and local anesthetic offered him oral anti-inflammatories he declined all we did discuss some appropriate shoe gear and icing therapy.  I will follow-up with him in approximately 1 month at which time we may need to consider orthotics.     Mallorie Norrod T. Lockhart, [place]

## 2021-12-24 IMAGING — MR MR KNEE*L* W/O CM
5 of 7 series · 23 of 40 positions shown · non-contrast
Comparison: None.

CLINICAL DATA: Left knee pain for 3-4 months since an injury
playing soccer.

EXAM:
MRI OF THE LEFT KNEE WITHOUT CONTRAST
TECHNIQUE: Multiplanar, multisequence MR imaging of the knee was performed. No
intravenous contrast was administered.

[Series 3: T2 fat-sat · axial · 4.0mm · 0.50mm/px · z∈[-50,+95]mm · 7 of 30 slices shown (1 of 2)]
[im 1/30]
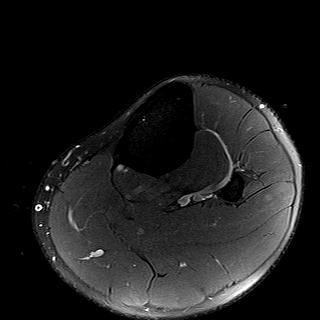
[im 5/30]
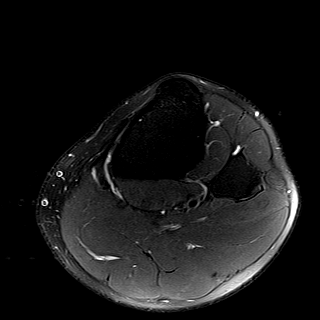
[im 10/30]
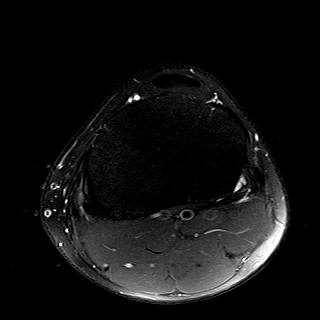
[im 15/30]
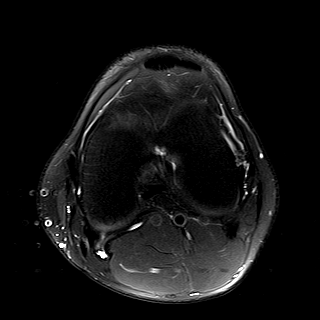
[im 20/30]
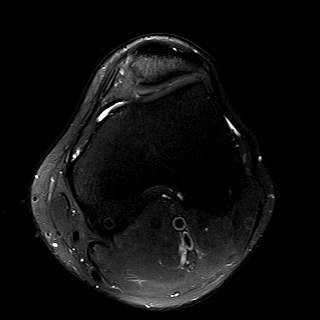
[im 25/30]
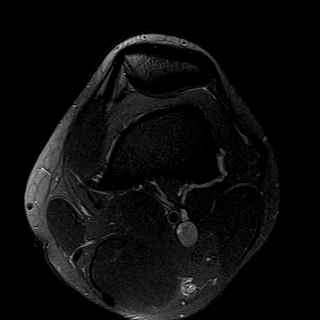
[im 30/30]
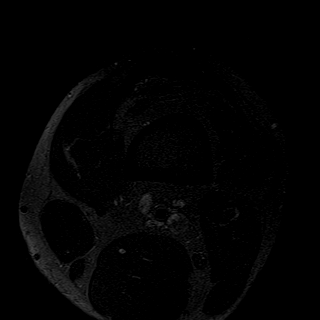

[Series 5: T2 fat-sat · coronal · 4.0mm · 0.29mm/px · 1 of 26 slices shown (2 of 2)]
[im 1/26]
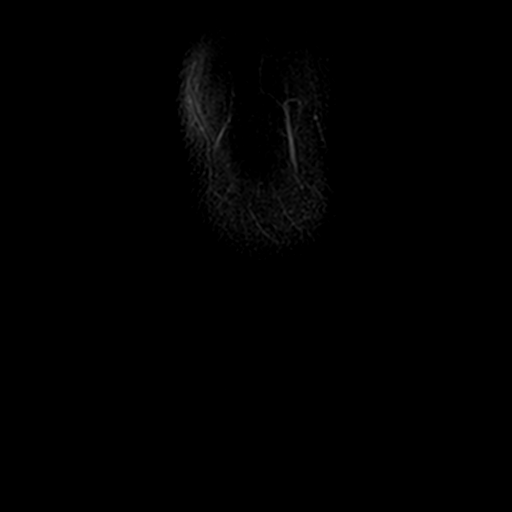

[Series 6: PD fat-sat · coronal · 3.0mm · 0.29mm/px · 7 of 32 slices shown (1 of 3)]
[im 1/32]
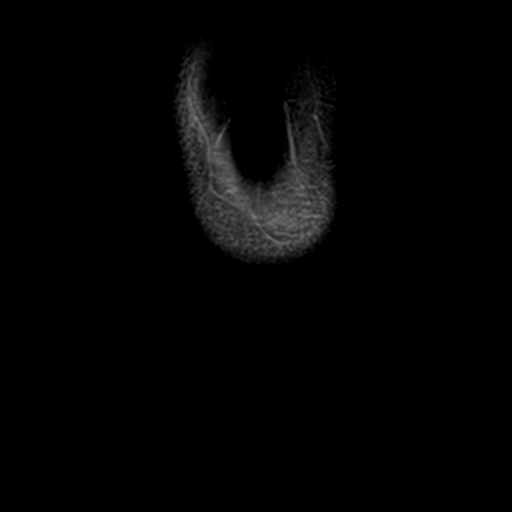
[im 6/32]
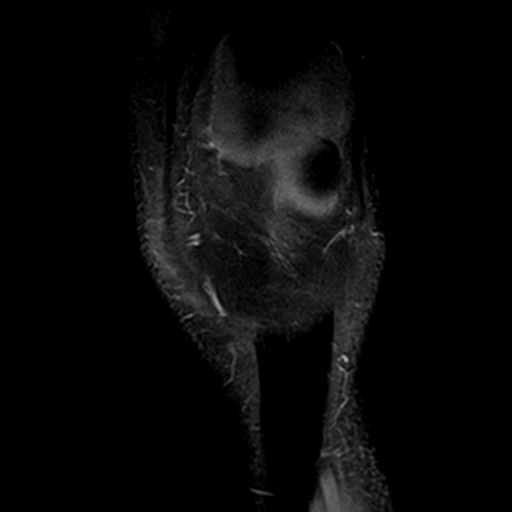
[im 11/32]
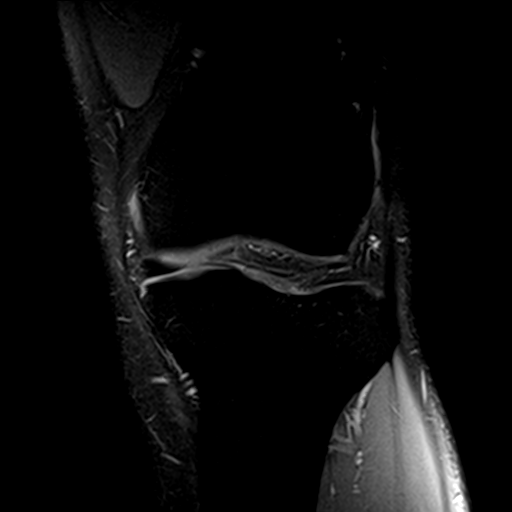
[im 16/32]
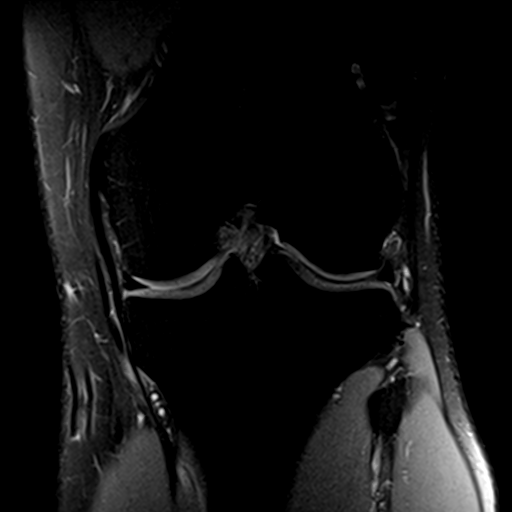
[im 21/32]
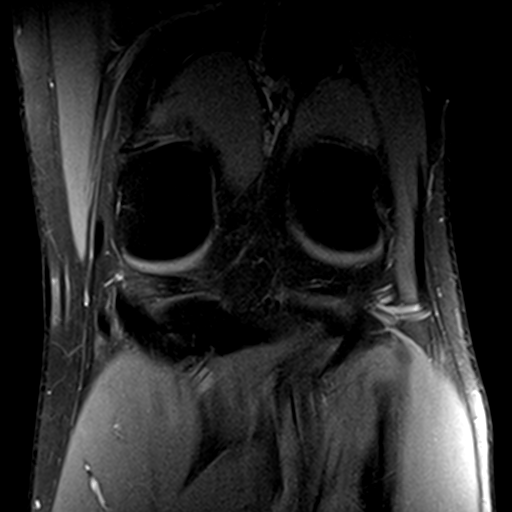
[im 26/32]
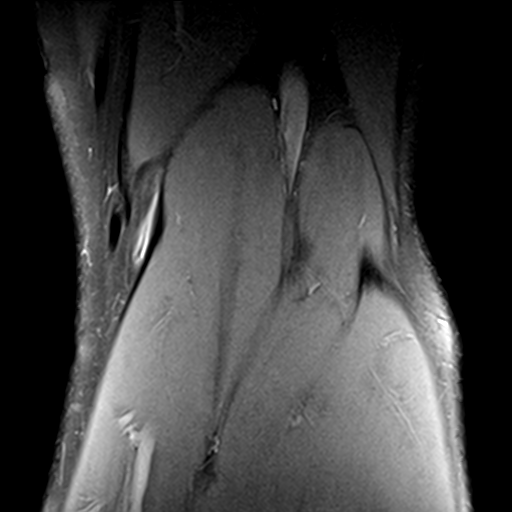
[im 32/32]
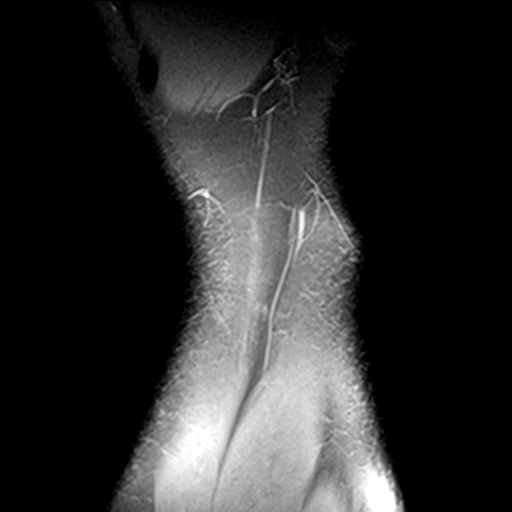

[Series 8: PD fat-sat · sagittal · 3.0mm · 0.29mm/px · 6 of 28 slices shown (2 of 3)]
[im 1/28]
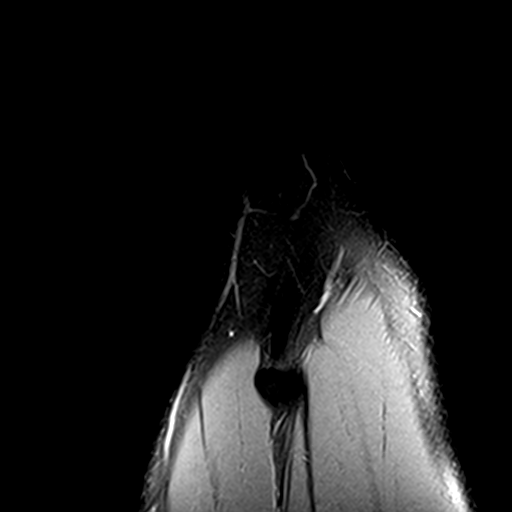
[im 6/28]
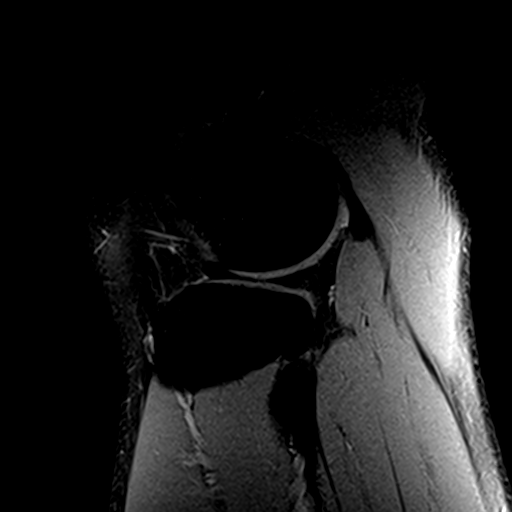
[im 11/28]
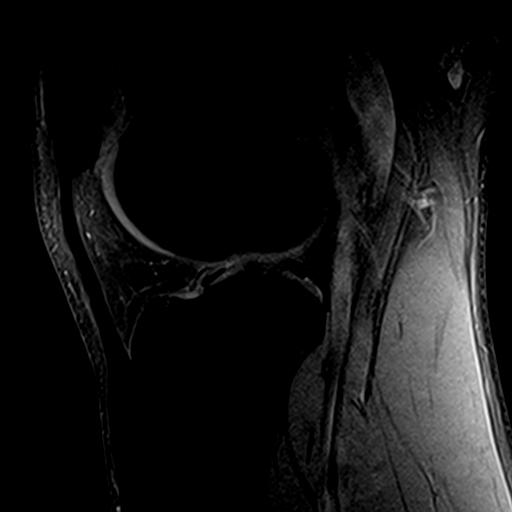
[im 17/28]
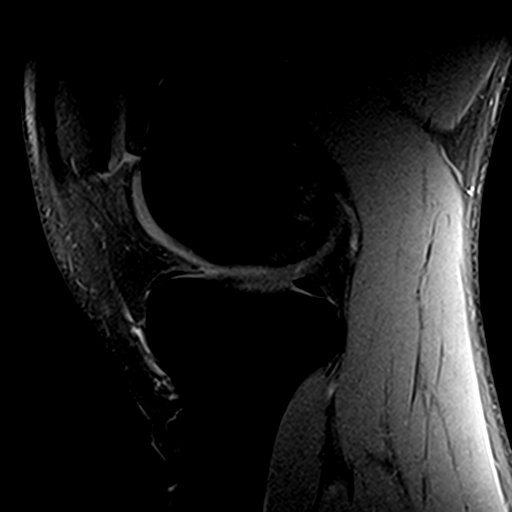
[im 22/28]
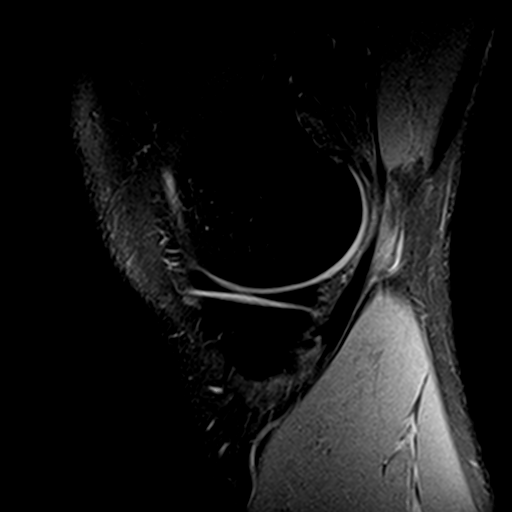
[im 28/28]
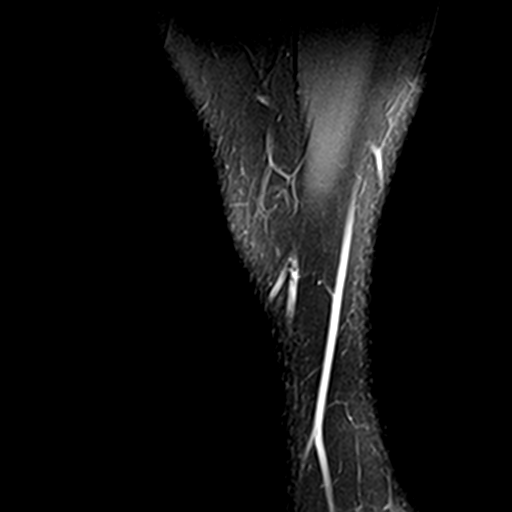

[Series 9: PD fat-sat · oblique · 2.3mm · 0.29mm/px · 2 of 11 slices shown (3 of 3)]
[im 1/11]
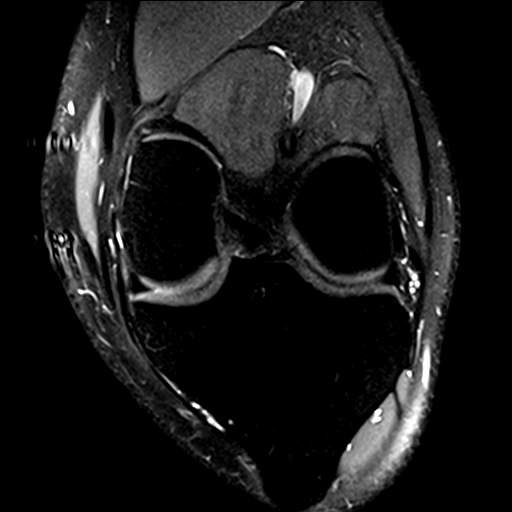
[im 11/11]
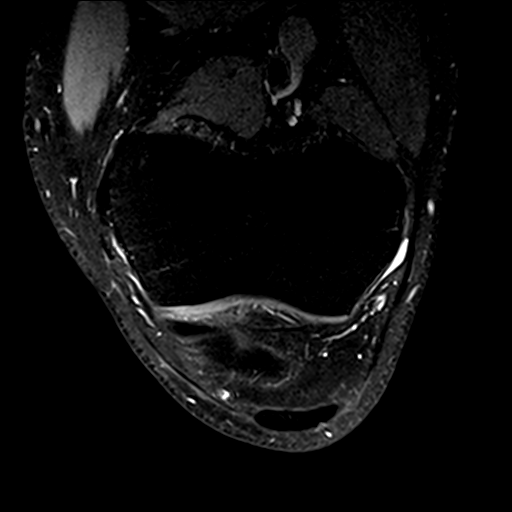

[23 of 40 positions shown; findings below may reference images not displayed]

FINDINGS: MENISCI

Medial meniscus:  Intact.

Lateral meniscus:  Intact.

LIGAMENTS

Cruciates:  Intact.

Collaterals:  Intact.

CARTILAGE

Patellofemoral:  Normal.

Medial:  Normal.

Lateral:  Normal.

Joint:  Trace amount of joint fluid.

Popliteal Fossa:  Tiny Baker's cyst.

Extensor Mechanism:  Intact.

Bones:  Normal marrow signal throughout.

Other: None.
IMPRESSION: Negative for meniscal or ligament tear. No finding to explain the
patient's symptoms. Trace amount of joint fluid and tiny Baker's
cyst noted.

## 2022-01-23 ENCOUNTER — Encounter: Payer: Self-pay | Admitting: Podiatry

## 2022-01-23 ENCOUNTER — Ambulatory Visit: Payer: 59 | Admitting: Podiatry

## 2022-01-23 DIAGNOSIS — M7752 Other enthesopathy of left foot: Secondary | ICD-10-CM

## 2022-01-23 NOTE — Progress Notes (Signed)
He presents today for follow-up of the bursitis fifth metatarsal joint left.  States that it still hurts though it was doing very well for quite some time and then just starting to go down to the soccer field started hurting again I had not even been playing soccer.  Objective: Vital signs are stable he is alert and oriented x3 he has pain on end range of motion of the fifth metatarsal phalangeal joint.  He has no pain on palpation of the metatarsal phalangeal joint itself particular medial or lateral.  Assessment: Capsulitis fifth metatarsophalangeal joint left foot.  Plan: Discussed etiology pathology conservative surgical therapies we discussed appropriate shoe gear once again today.  At this point he is going to follow-up for casting.  He will be casted to a neutral position though he will have a subfifth met head cut out left foot.

## 2022-03-25 ENCOUNTER — Telehealth: Payer: Self-pay | Admitting: Podiatry

## 2022-03-25 NOTE — Telephone Encounter (Signed)
Left message to call back to schedule for Friday to cast for orthotics

## 2022-04-17 ENCOUNTER — Telehealth: Payer: Self-pay | Admitting: Podiatry

## 2022-04-17 NOTE — Telephone Encounter (Signed)
LVM for pt to call back for an appt for orthotics casting.

## 2022-08-12 ENCOUNTER — Other Ambulatory Visit: Payer: Self-pay | Admitting: Family Medicine

## 2022-08-12 ENCOUNTER — Ambulatory Visit
Admission: RE | Admit: 2022-08-12 | Discharge: 2022-08-12 | Disposition: A | Discharge status: Home / Self Care | Payer: Medicaid Other | Source: Ambulatory Visit | Attending: Family Medicine | Admitting: Family Medicine

## 2022-08-12 DIAGNOSIS — M25562 Pain in left knee: Secondary | ICD-10-CM

## 2023-06-05 ENCOUNTER — Other Ambulatory Visit: Payer: Self-pay | Admitting: Student

## 2023-06-05 DIAGNOSIS — M25571 Pain in right ankle and joints of right foot: Secondary | ICD-10-CM

## 2023-06-15 ENCOUNTER — Encounter: Payer: Self-pay | Admitting: Student

## 2023-06-18 ENCOUNTER — Ambulatory Visit
Admission: RE | Admit: 2023-06-18 | Discharge: 2023-06-18 | Disposition: A | Discharge status: Home / Self Care | Payer: Medicaid Other | Source: Ambulatory Visit | Attending: Student | Admitting: Student

## 2023-06-18 DIAGNOSIS — M25571 Pain in right ankle and joints of right foot: Secondary | ICD-10-CM

## 2024-03-01 ENCOUNTER — Ambulatory Visit: Admitting: Podiatry

## 2024-03-17 ENCOUNTER — Ambulatory Visit: Admitting: Podiatry

## 2024-03-17 DIAGNOSIS — M7742 Metatarsalgia, left foot: Secondary | ICD-10-CM | POA: Diagnosis not present

## 2024-03-17 DIAGNOSIS — M19071 Primary osteoarthritis, right ankle and foot: Secondary | ICD-10-CM

## 2024-03-17 DIAGNOSIS — M7751 Other enthesopathy of right foot: Secondary | ICD-10-CM

## 2024-03-17 DIAGNOSIS — M7741 Metatarsalgia, right foot: Secondary | ICD-10-CM

## 2024-03-17 DIAGNOSIS — L603 Nail dystrophy: Secondary | ICD-10-CM | POA: Diagnosis not present

## 2024-03-17 DIAGNOSIS — M7752 Other enthesopathy of left foot: Secondary | ICD-10-CM

## 2024-03-17 DIAGNOSIS — M19072 Primary osteoarthritis, left ankle and foot: Secondary | ICD-10-CM

## 2024-03-17 NOTE — Progress Notes (Signed)
 He presents today concerned that he may have a onychomycosis because his toenails continue to fall off and regrow.  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  He states that the bursitis but he fifth metatarsal head area is painful and he would like to consider getting those orthotics that we discussed previously.  Objective: Vitals are stable alert oriented x 3.  There is no erythema edema cellulitis drainage or odor is hallux nails bilaterally do demonstrate some striations and discolorations  Assessment: Nail dystrophy hallux bilaterally does not demonstrate onychomycosis.  Also demonstrates painful subfifth metatarsal head lesions.  Plan: He will be casted for orthotics due to the bursitis and metatarsal deformity with metatarsalgia.

## 2024-03-17 NOTE — Progress Notes (Signed)
  Patient was present and evaluated for Custom molded foot orthotics. Patient will benefit from CFO's to provide total contact to BIL MLA's helping to balance and distribute body weight more evenly across BIL feet helping to reduce plantar pressure and pain. Orthotic will also encourage FF / RF alignment  Patient was scanned today and will return for fitting upon receipt

## 2024-04-21 ENCOUNTER — Ambulatory Visit

## 2024-04-21 NOTE — Progress Notes (Signed)
 Patient presents today to pick up custom molded foot orthotics, diagnosed with Metatarsalgia, bursitis BIL Feet by Dr. Verta .   Orthotics were dispensed and fit was satisfactory. Reviewed instructions for break-in and wear. Written instructions given to patient.  Patient will follow up as needed.   Lolita Schultze CPed, CFo, CFm
